# Patient Record
Sex: Male | Born: 1975 | Hispanic: No | Marital: Single
Health system: Southern US, Community
[De-identification: ages and names within clinical notes are randomized; demographics above are authoritative.]

---

## 2013-03-15 ENCOUNTER — Emergency Department (HOSPITAL_COMMUNITY): Payer: No Typology Code available for payment source

## 2013-03-15 ENCOUNTER — Emergency Department (HOSPITAL_COMMUNITY)
Admission: EM | Admit: 2013-03-15 | Discharge: 2013-03-15 | Disposition: A | Discharge status: Home / Self Care | Payer: No Typology Code available for payment source | Attending: Emergency Medicine | Admitting: Emergency Medicine

## 2013-03-15 ENCOUNTER — Encounter (HOSPITAL_COMMUNITY): Payer: Self-pay | Admitting: Radiology

## 2013-03-15 DIAGNOSIS — S0280XA Fracture of other specified skull and facial bones, unspecified side, initial encounter for closed fracture: Secondary | ICD-10-CM | POA: Insufficient documentation

## 2013-03-15 DIAGNOSIS — S59919A Unspecified injury of unspecified forearm, initial encounter: Secondary | ICD-10-CM

## 2013-03-15 DIAGNOSIS — S59909A Unspecified injury of unspecified elbow, initial encounter: Secondary | ICD-10-CM | POA: Insufficient documentation

## 2013-03-15 DIAGNOSIS — S0181XA Laceration without foreign body of other part of head, initial encounter: Secondary | ICD-10-CM

## 2013-03-15 DIAGNOSIS — S6990XA Unspecified injury of unspecified wrist, hand and finger(s), initial encounter: Secondary | ICD-10-CM | POA: Insufficient documentation

## 2013-03-15 DIAGNOSIS — S40019A Contusion of unspecified shoulder, initial encounter: Secondary | ICD-10-CM | POA: Insufficient documentation

## 2013-03-15 DIAGNOSIS — S161XXA Strain of muscle, fascia and tendon at neck level, initial encounter: Secondary | ICD-10-CM

## 2013-03-15 DIAGNOSIS — S0292XA Unspecified fracture of facial bones, initial encounter for closed fracture: Secondary | ICD-10-CM

## 2013-03-15 DIAGNOSIS — Z23 Encounter for immunization: Secondary | ICD-10-CM | POA: Insufficient documentation

## 2013-03-15 DIAGNOSIS — Z880 Allergy status to penicillin: Secondary | ICD-10-CM | POA: Insufficient documentation

## 2013-03-15 DIAGNOSIS — Y9389 Activity, other specified: Secondary | ICD-10-CM | POA: Insufficient documentation

## 2013-03-15 DIAGNOSIS — S40021A Contusion of right upper arm, initial encounter: Secondary | ICD-10-CM

## 2013-03-15 DIAGNOSIS — S0180XA Unspecified open wound of other part of head, initial encounter: Secondary | ICD-10-CM | POA: Insufficient documentation

## 2013-03-15 DIAGNOSIS — S139XXA Sprain of joints and ligaments of unspecified parts of neck, initial encounter: Secondary | ICD-10-CM | POA: Insufficient documentation

## 2013-03-15 DIAGNOSIS — Y92410 Unspecified street and highway as the place of occurrence of the external cause: Secondary | ICD-10-CM | POA: Insufficient documentation

## 2013-03-15 LAB — URINALYSIS, ROUTINE W REFLEX MICROSCOPIC
Bilirubin Urine: NEGATIVE
Glucose, UA: NEGATIVE mg/dL
KETONES UR: NEGATIVE mg/dL
LEUKOCYTES UA: NEGATIVE
Nitrite: NEGATIVE
PROTEIN: NEGATIVE mg/dL
Specific Gravity, Urine: 1.03 (ref 1.005–1.030)
UROBILINOGEN UA: 0.2 mg/dL (ref 0.0–1.0)
pH: 6.5 (ref 5.0–8.0)

## 2013-03-15 LAB — PROTIME-INR
INR: 0.91 (ref 0.00–1.49)
Prothrombin Time: 12.1 seconds (ref 11.6–15.2)

## 2013-03-15 LAB — COMPREHENSIVE METABOLIC PANEL
ALT: 50 U/L (ref 0–53)
AST: 42 U/L — ABNORMAL HIGH (ref 0–37)
Albumin: 3.6 g/dL (ref 3.5–5.2)
Alkaline Phosphatase: 91 U/L (ref 39–117)
BILIRUBIN TOTAL: 0.3 mg/dL (ref 0.3–1.2)
BUN: 16 mg/dL (ref 6–23)
CHLORIDE: 103 meq/L (ref 96–112)
CO2: 24 meq/L (ref 19–32)
Calcium: 9.1 mg/dL (ref 8.4–10.5)
Creatinine, Ser: 1.16 mg/dL (ref 0.50–1.35)
GFR calc Af Amer: >90 mL/min (ref >90)
GFR, EST NON AFRICAN AMERICAN: 80 mL/min — AB (ref >90)
Glucose, Bld: 107 mg/dL — ABNORMAL HIGH (ref 70–99)
Potassium: 3.6 mEq/L — ABNORMAL LOW (ref 3.7–5.3)
Sodium: 138 mEq/L (ref 137–147)
Total Protein: 6.9 g/dL (ref 6.0–8.3)

## 2013-03-15 LAB — CBC
HCT: 45 % (ref 39.0–52.0)
Hemoglobin: 15.4 g/dL (ref 13.0–17.0)
MCH: 26.6 pg (ref 26.0–34.0)
MCHC: 34.2 g/dL (ref 30.0–36.0)
MCV: 77.9 fL — ABNORMAL LOW (ref 78.0–100.0)
PLATELETS: 220 10*3/uL (ref 150–400)
RBC: 5.78 MIL/uL (ref 4.22–5.81)
RDW: 13.9 % (ref 11.5–15.5)
WBC: 12.4 10*3/uL — AB (ref 4.0–10.5)

## 2013-03-15 LAB — URINE MICROSCOPIC-ADD ON

## 2013-03-15 MED ORDER — CLINDAMYCIN HCL 300 MG PO CAPS
300.0000 mg | ORAL_CAPSULE | Freq: Once | ORAL | Status: AC
  Administered 2013-03-15: 300 mg via ORAL
  Filled 2013-03-15: qty 1

## 2013-03-15 MED ORDER — FENTANYL CITRATE 0.05 MG/ML IJ SOLN
100.0000 ug | Freq: Once | INTRAMUSCULAR | Status: AC
  Administered 2013-03-15: 100 ug via INTRAVENOUS
  Filled 2013-03-15: qty 2

## 2013-03-15 MED ORDER — OXYCODONE-ACETAMINOPHEN 5-325 MG PO TABS: 1.0000 | ORAL_TABLET | ORAL | Status: AC | PRN

## 2013-03-15 MED ORDER — ONDANSETRON HCL 4 MG/2ML IJ SOLN
4.0000 mg | Freq: Once | INTRAMUSCULAR | Status: AC
Start: 2013-03-15 — End: 2013-03-15
  Administered 2013-03-15: 4 mg via INTRAVENOUS
  Filled 2013-03-15: qty 2

## 2013-03-15 MED ORDER — CLINDAMYCIN HCL 300 MG PO CAPS: 300.0000 mg | ORAL_CAPSULE | Freq: Four times a day (QID) | ORAL | Status: AC

## 2013-03-15 MED ORDER — ONDANSETRON 4 MG PO TBDP
4.0000 mg | ORAL_TABLET | Freq: Once | ORAL | Status: AC
  Administered 2013-03-15: 4 mg via ORAL
  Filled 2013-03-15: qty 1

## 2013-03-15 MED ORDER — METHOCARBAMOL 500 MG PO TABS
500.0000 mg | ORAL_TABLET | Freq: Once | ORAL | Status: AC
  Administered 2013-03-15: 500 mg via ORAL
  Filled 2013-03-15: qty 1

## 2013-03-15 MED ORDER — OXYCODONE-ACETAMINOPHEN 5-325 MG PO TABS
2.0000 | ORAL_TABLET | Freq: Once | ORAL | Status: AC
  Administered 2013-03-15: 2 via ORAL
  Filled 2013-03-15: qty 2

## 2013-03-15 MED ORDER — IOHEXOL 300 MG/ML  SOLN
100.0000 mL | Freq: Once | INTRAMUSCULAR | Status: AC | PRN
  Administered 2013-03-15: 100 mL via INTRAVENOUS

## 2013-03-15 MED ORDER — TETANUS-DIPHTH-ACELL PERTUSSIS 5-2.5-18.5 LF-MCG/0.5 IM SUSP
0.5000 mL | Freq: Once | INTRAMUSCULAR | Status: AC
  Administered 2013-03-15: 0.5 mL via INTRAMUSCULAR
  Filled 2013-03-15: qty 0.5

## 2013-03-15 MED ORDER — SODIUM CHLORIDE 0.9 % IV BOLUS (SEPSIS)
1000.0000 mL | Freq: Once | INTRAVENOUS | Status: AC
  Administered 2013-03-15: 1000 mL via INTRAVENOUS

## 2013-03-15 MED ORDER — METHOCARBAMOL 500 MG PO TABS: 500.0000 mg | ORAL_TABLET | Freq: Four times a day (QID) | ORAL | Status: AC | PRN

## 2013-03-15 NOTE — Progress Notes (Signed)
Orthopedic Tech Progress Note Patient Details:  Jonathan Nichols [birth year] [id number]  Ortho Devices Type of Ortho Device: Arm sling   Haskell Flirtewsome, Ondine Gemme M 03/15/2013, 6:47 AM
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
## 2013-03-15 NOTE — ED Notes (Signed)
Pt c/o nausea while ambulating, pt placed in sitting position in stretcher & given ice chips to see how well it is tolerated.
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
## 2013-03-15 NOTE — ED Notes (Signed)
Ortho paged. 

## 2013-03-15 NOTE — ED Notes (Signed)
Pt received x 3 lunch bags for self & family, pt received dosages of medications prior to discharge as requested for medications that he received a prescription for with discharge instructions, pt verbalizes understanding of follow up care & medication regimen, pt given ice pack to take home, pt verbalizes understanding of when to return to ED

## 2013-03-15 NOTE — ED Notes (Signed)
Pt removed his cervical collar. Pt given a urinal, and sat up in bed, with permission from [place], [place]

## 2013-03-15 NOTE — Discharge Instructions (Signed)
Call and make appointment to followup with the ophthalmologist. He also need to call and set appointment to see be seen by the ENT surgeon on Monday, 03/21/13. You should have your sutures removed at that time too. Take pain medications as prescribed. Recurrent return immediately for worsening pain, focal weakness, numbness, persistent vomiting, vision changes, fever or for any concerns. He should keep your head elevated and refrain from blowing her nose for the next 2 weeks. Take antibiotics as prescribed.   Cervical Sprain A cervical sprain is an injury in the neck in which the strong, fibrous tissues (ligaments) that connect your neck bones stretch or tear. Cervical sprains can range from mild to severe. Severe cervical sprains can cause the neck vertebrae to be unstable. This can lead to damage of the spinal cord and can result in serious nervous system problems. The amount of time it takes for a cervical sprain to get better depends on the cause and extent of the injury. Most cervical sprains heal in 1 to 3 weeks. CAUSES  Severe cervical sprains may be caused by:   Contact sport injuries (such as from football, rugby, wrestling, hockey, auto racing, gymnastics, diving, martial arts, or boxing).   Motor vehicle collisions.   Whiplash injuries. This is an injury from a sudden forward-and backward whipping movement of the head and neck.  Falls.  Mild cervical sprains may be caused by:   Being in an awkward position, such as while cradling a telephone between your ear and shoulder.   Sitting in a chair that does not offer proper support.   Working at a poorly Marketing executive station.   Looking up or down for long periods of time.  SYMPTOMS   Pain, soreness, stiffness, or a burning sensation in the front, back, or sides of the neck. This discomfort may develop immediately after the injury or slowly, 24 hours or more after the injury.   Pain or tenderness directly in the middle  of the back of the neck.   Shoulder or upper back pain.   Limited ability to move the neck.   Headache.   Dizziness.   Weakness, numbness, or tingling in the hands or arms.   Muscle spasms.   Difficulty swallowing or chewing.   Tenderness and swelling of the neck.  DIAGNOSIS  Most of the time your health care provider can diagnose a cervical sprain by taking your history and doing a physical exam. Your health care provider will ask about previous neck injuries and any known neck problems, such as arthritis in the neck. X-rays may be taken to find out if there are any other problems, such as with the bones of the neck. Other tests, such as a CT scan or MRI, may also be needed.  TREATMENT  Treatment depends on the severity of the cervical sprain. Mild sprains can be treated with rest, keeping the neck in place (immobilization), and pain medicines. Severe cervical sprains are immediately immobilized. Further treatment is done to help with pain, muscle spasms, and other symptoms and may include:  Medicines, such as pain relievers, numbing medicines, or muscle relaxants.   Physical therapy. This may involve stretching exercises, strengthening exercises, and posture training. Exercises and improved posture can help stabilize the neck, strengthen muscles, and help stop symptoms from returning.  HOME CARE INSTRUCTIONS   Put ice on the injured area.   Put ice in a plastic bag.   Place a towel between your skin and the bag.   Leave  the ice on for 15 20 minutes, 3 4 times a day.   If your injury was severe, you may have been given a cervical collar to wear. A cervical collar is a two-piece collar designed to keep your neck from moving while it heals.  Do not remove the collar unless instructed by your health care provider.  If you have long hair, keep it outside of the collar.  Ask your health care provider before making any adjustments to your collar. Minor adjustments  may be required over time to improve comfort and reduce pressure on your chin or on the back of your head.  Ifyou are allowed to remove the collar for cleaning or bathing, follow your health care provider's instructions on how to do so safely.  Keep your collar clean by wiping it with mild soap and water and drying it completely. If the collar you have been given includes removable pads, remove them every 1 2 days and hand wash them with soap and water. Allow them to air dry. They should be completely dry before you wear them in the collar.  If you are allowed to remove the collar for cleaning and bathing, wash and dry the skin of your neck. Check your skin for irritation or sores. If you see any, tell your health care provider.  Do not drive while wearing the collar.   Only take over-the-counter or prescription medicines for pain, discomfort, or fever as directed by your health care provider.   Keep all follow-up appointments as directed by your health care provider.   Keep all physical therapy appointments as directed by your health care provider.   Make any needed adjustments to your workstation to promote good posture.   Avoid positions and activities that make your symptoms worse.   Warm up and stretch before being active to help prevent problems.  SEEK MEDICAL CARE IF:   Your pain is not controlled with medicine.   You are unable to decrease your pain medicine over time as planned.   Your activity level is not improving as expected.  SEEK IMMEDIATE MEDICAL CARE IF:   You develop any bleeding.  You develop stomach upset.  You have signs of an allergic reaction to your medicine.   Your symptoms get worse.   You develop new, unexplained symptoms.   You have numbness, tingling, weakness, or paralysis in any part of your body.  MAKE SURE YOU:   Understand these instructions.  Will watch your condition.  Will get help right away if you are not doing well  or get worse. Document Released: 10/20/2006 Document Revised: 10/13/2012 Document Reviewed: 06/30/2012 [facility] Patient Information 2014 [place], [place].  Facial Laceration  A facial laceration is a cut on the face. These injuries can be painful and cause bleeding. Lacerations usually heal quickly, but they need special care to reduce scarring. DIAGNOSIS  Your health care provider will take a medical history, ask for details about how the injury occurred, and examine the wound to determine how deep the cut is. TREATMENT  Some facial lacerations may not require closure. Others may not be able to be closed because of an increased risk of infection. The risk of infection and the chance for successful closure will depend on various factors, including the amount of time since the injury occurred. The wound may be cleaned to help prevent infection. If closure is appropriate, pain medicines may be given if needed. Your health care provider will use stitches (sutures), wound glue (adhesive),  or skin adhesive strips to repair the laceration. These tools bring the skin edges together to allow for faster healing and a better cosmetic outcome. If needed, you may also be given a tetanus shot. HOME CARE INSTRUCTIONS  Only take over-the-counter or prescription medicines as directed by your health care provider.  Follow your health care provider's instructions for wound care. These instructions will vary depending on the technique used for closing the wound. For Sutures:  Keep the wound clean and dry.   If you were given a bandage (dressing), you should change it at least once a day. Also change the dressing if it becomes wet or dirty, or as directed by your health care provider.   Wash the wound with soap and water 2 times a day. Rinse the wound off with water to remove all soap. Pat the wound dry with a clean towel.   After cleaning, apply a thin layer of the antibiotic ointment recommended by your  health care provider. This will help prevent infection and keep the dressing from sticking.   You may shower as usual after the first 24 hours. Do not soak the wound in water until the sutures are removed.   Get your sutures removed as directed by your health care provider. With facial lacerations, sutures should usually be taken out after 4 5 days to avoid stitch marks.   Wait a few days after your sutures are removed before applying any makeup. For Skin Adhesive Strips:  Keep the wound clean and dry.   Do not get the skin adhesive strips wet. You may bathe carefully, using caution to keep the wound dry.   If the wound gets wet, pat it dry with a clean towel.   Skin adhesive strips will fall off on their own. You may trim the strips as the wound heals. Do not remove skin adhesive strips that are still stuck to the wound. They will fall off in time.  For Wound Adhesive:  You may briefly wet your wound in the shower or bath. Do not soak or scrub the wound. Do not swim. Avoid periods of heavy sweating until the skin adhesive has fallen off on its own. After showering or bathing, gently pat the wound dry with a clean towel.   Do not apply liquid medicine, cream medicine, ointment medicine, or makeup to your wound while the skin adhesive is in place. This may loosen the film before your wound is healed.   If a dressing is placed over the wound, be careful not to apply tape directly over the skin adhesive. This may cause the adhesive to be pulled off before the wound is healed.   Avoid prolonged exposure to sunlight or tanning lamps while the skin adhesive is in place.  The skin adhesive will usually remain in place for 5 10 days, then naturally fall off the skin. Do not pick at the adhesive film.  After Healing: Once the wound has healed, cover the wound with sunscreen during the day for 1 full year. This can help minimize scarring. Exposure to ultraviolet light in the first year  will darken the scar. It can take 1 2 years for the scar to lose its redness and to heal completely.  SEEK IMMEDIATE MEDICAL CARE IF:  You have redness, pain, or swelling around the wound.   You see ayellowish-white fluid (pus) coming from the wound.   You have chills or a fever.  MAKE SURE YOU:  Understand these instructions.  Will  watch your condition.  Will get help right away if you are not doing well or get worse. Document Released: 01/31/2004 Document Revised: 10/13/2012 Document Reviewed: 08/05/2012 [facility] Patient Information 2014 [place], [place].  Facial Fracture A facial fracture is a break in one of the bones of your face. HOME CARE INSTRUCTIONS   Protect the injured part of your face until it is healed.  Do not participate in activities which give chance for re-injury until your doctor approves.  Gently wash and dry your face.  Wear head and facial protection while riding a bicycle, motorcycle, or snowmobile. SEEK MEDICAL CARE IF:   An oral temperature above 102 F (38.9 C) develops.  You have severe headaches or notice changes in your vision.  You have new numbness or tingling in your face.  You develop nausea (feeling sick to your stomach), vomiting or a stiff neck. SEEK IMMEDIATE MEDICAL CARE IF:   You develop difficulty seeing or experience double vision.  You become dizzy, lightheaded, or faint.  You develop trouble speaking, breathing, or swallowing.  You have a watery discharge from your nose or ear. MAKE SURE YOU:   Understand these instructions.  Will watch your condition.  Will get help right away if you are not doing well or get worse. Document Released: 12/23/2004 Document Revised: 03/17/2011 Document Reviewed: 08/12/2007 [facility] Patient Information 2014 [place], [place].  Head Injury, Adult You have received a head injury. It does not appear serious at this time. Headaches and vomiting are common following head injury. It should  be easy to awaken from sleeping. Sometimes it is necessary for you to stay in the emergency department for a while for observation. Sometimes admission to the hospital may be needed. After injuries such as yours, most problems occur within the first 24 hours, but side effects may occur up to 7 10 days after the injury. It is important for you to carefully monitor your condition and contact your health care provider or seek immediate medical care if there is a change in your condition. WHAT ARE THE TYPES OF HEAD INJURIES? Head injuries can be as minor as a bump. Some head injuries can be more severe. More severe head injuries include:  A jarring injury to the brain (concussion).  A bruise of the brain (contusion). This mean there is bleeding in the brain that can cause swelling.  A cracked skull (skull fracture).  Bleeding in the brain that collects, clots, and forms a bump (hematoma). WHAT CAUSES A HEAD INJURY? A serious head injury is most likely to happen to someone who is in a car wreck and is not wearing a seat belt. Other causes of major head injuries include bicycle or motorcycle accidents, sports injuries, and falls. HOW ARE HEAD INJURIES DIAGNOSED? A complete history of the event leading to the injury and your current symptoms will be helpful in diagnosing head injuries. Many times, pictures of the brain, such as CT or MRI are needed to see the extent of the injury. Often, an overnight hospital stay is necessary for observation.  WHEN SHOULD I SEEK IMMEDIATE MEDICAL CARE?  You should get help right away if:  You have confusion or drowsiness.  You feel sick to your stomach (nauseous) or have continued, forceful vomiting.  You have dizziness or unsteadiness that is getting worse.  You have severe, continued headaches not relieved by medicine. Only take over-the-counter or prescription medicines for pain, fever, or discomfort as directed by your health care provider.  You do not  have  normal function of the arms or legs or are unable to walk.  You notice changes in the black spots in the center of the colored part of your eye (pupil).  You have a clear or bloody fluid coming from your nose or ears.  You have a loss of vision. During the next 24 hours after the injury, you must stay with someone who can watch you for the warning signs. This person should contact local emergency services (911 in the [place] if you have seizures, you become unconscious, or you are unable to wake up. HOW CAN I PREVENT A HEAD INJURY IN THE FUTURE? The most important factor for preventing major head injuries is avoiding motor vehicle accidents. To minimize the potential for damage to your head, it is crucial to wear seat belts while riding in motor vehicles. Wearing helmets while bike riding and playing collision sports (like football) is also helpful. Also, avoiding dangerous activities around the house will further help reduce your risk of head injury.  WHEN CAN I RETURN TO NORMAL ACTIVITIES AND ATHLETICS? You should be reevaluated by your health care provider before returning to these activities. If you have any of the following symptoms, you should not return to activities or contact sports until 1 week after the symptoms have stopped:  Persistent headache.  Dizziness or vertigo.  Poor attention and concentration.  Confusion.  Memory problems.  Nausea or vomiting.  Fatigue or tire easily.  Irritability.  Intolerant of bright lights or loud noises.  Anxiety or depression.  Disturbed sleep. MAKE SURE YOU:   Understand these instructions.  Will watch your condition.  Will get help right away if you are not doing well or get worse. Document Released: 12/23/2004 Document Revised: 10/13/2012 Document Reviewed: 08/30/2012 [facility] Patient Information 2014 [place], [place].  Multiple Traumas Bruises and sore muscles are common after injuries. These usually feel worse for the first  24 hours. You may have more stiffness and soreness over the next several hours to several days. It may be worse when you wake up the first morning following your injury. You will improve with each passing day. The amount of improvement often depends on the amount of damage done. HOME CARE INSTRUCTIONS   Put ice on the injured area.  Put ice in a plastic bag.  Place a towel between your skin and the bag.  Leave the ice on for 15-20 minutes, 03-04 times a day.  Drink more fluids than normal. Do not drink alcohol.  Take a hot or warm shower or bath once or twice a day. This will increase blood flow to sore muscles.  Return to activity as tolerated. Lifting may aggravate neck or back pain.  Only take over-the-counter or prescription medicines for pain, discomfort, or fever as directed by your caregiver. Do not use aspirin. This may increase bruising because aspirin decreases the ability of blood to form clots that stop bleeding. SEEK IMMEDIATE MEDICAL CARE IF:  You have numbness, tingling, weakness, or problems with the use of your arms or legs.  You have severe headaches not relieved with medicines.  You have changes in bowel or bladder control.  You develop increasing pain in any areas of the body.  You have shortness of breath, dizziness, or fainting.  You have a fever.  You feel sick to your stomach (nauseous), throw up (vomit), or sweat.  You have increasing belly pain.  You have blood in your urine, stool, or vomit.  You have pain in  either shoulder or in an area where a shoulder strap would be.  You feel lightheaded or faint.  You feel you may be getting worse rather than better.  Your symptoms are worsening. MAKE SURE YOU:   Understand these instructions.  Will watch your condition.  Will get help right away if you are not doing well or get worse. Document Released: 01/12/2007 Document Revised: 03/17/2011 Document Reviewed: 06/12/2008 [facility] Patient  Information 2014 [place], [place].

## 2013-03-15 NOTE — ED Notes (Signed)
Pt states that he does not want anything else for pain.  

## 2013-03-15 NOTE — ED Notes (Signed)
Effie ShyWentz, MD at bedside, [facility], RN at bedside to ambulate pt & to give lunch bag & PO fluids

## 2013-03-15 NOTE — ED Notes (Signed)
Patient transported to CT 
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
## 2013-03-17 ENCOUNTER — Emergency Department (HOSPITAL_COMMUNITY)
Admission: EM | Admit: 2013-03-17 | Discharge: 2013-03-17 | Disposition: A | Discharge status: Home / Self Care | Payer: No Typology Code available for payment source | Attending: Emergency Medicine | Admitting: Emergency Medicine

## 2013-03-17 ENCOUNTER — Emergency Department (HOSPITAL_COMMUNITY): Payer: No Typology Code available for payment source

## 2013-03-17 DIAGNOSIS — H538 Other visual disturbances: Secondary | ICD-10-CM | POA: Insufficient documentation

## 2013-03-17 DIAGNOSIS — M549 Dorsalgia, unspecified: Secondary | ICD-10-CM | POA: Insufficient documentation

## 2013-03-17 DIAGNOSIS — Z792 Long term (current) use of antibiotics: Secondary | ICD-10-CM | POA: Insufficient documentation

## 2013-03-17 DIAGNOSIS — Z79899 Other long term (current) drug therapy: Secondary | ICD-10-CM | POA: Insufficient documentation

## 2013-03-17 DIAGNOSIS — M25539 Pain in unspecified wrist: Secondary | ICD-10-CM | POA: Insufficient documentation

## 2013-03-17 DIAGNOSIS — G8911 Acute pain due to trauma: Secondary | ICD-10-CM | POA: Insufficient documentation

## 2013-03-17 DIAGNOSIS — F0781 Postconcussional syndrome: Secondary | ICD-10-CM

## 2013-03-17 DIAGNOSIS — Z880 Allergy status to penicillin: Secondary | ICD-10-CM | POA: Insufficient documentation

## 2013-03-17 LAB — CBC WITH DIFFERENTIAL/PLATELET
BASOS ABS: 0.1 10*3/uL (ref 0.0–0.1)
Basophils Relative: 1 % (ref 0–1)
EOS ABS: 0.3 10*3/uL (ref 0.0–0.7)
EOS PCT: 3 % (ref 0–5)
HEMATOCRIT: 45.3 % (ref 39.0–52.0)
Hemoglobin: 15.4 g/dL (ref 13.0–17.0)
Lymphocytes Relative: 11 % — ABNORMAL LOW (ref 12–46)
Lymphs Abs: 1.2 10*3/uL (ref 0.7–4.0)
MCH: 26.5 pg (ref 26.0–34.0)
MCHC: 34 g/dL (ref 30.0–36.0)
MCV: 77.8 fL — AB (ref 78.0–100.0)
Monocytes Absolute: 0.9 10*3/uL (ref 0.1–1.0)
Monocytes Relative: 8 % (ref 3–12)
NEUTROS ABS: 8.8 10*3/uL — AB (ref 1.7–7.7)
Neutrophils Relative %: 78 % — ABNORMAL HIGH (ref 43–77)
Platelets: 210 10*3/uL (ref 150–400)
RBC: 5.82 MIL/uL — ABNORMAL HIGH (ref 4.22–5.81)
RDW: 13.5 % (ref 11.5–15.5)
WBC: 11.3 10*3/uL — ABNORMAL HIGH (ref 4.0–10.5)

## 2013-03-17 LAB — COMPREHENSIVE METABOLIC PANEL
ALBUMIN: 3.7 g/dL (ref 3.5–5.2)
ALT: 30 U/L (ref 0–53)
AST: 28 U/L (ref 0–37)
Alkaline Phosphatase: 93 U/L (ref 39–117)
BUN: 15 mg/dL (ref 6–23)
CALCIUM: 9.2 mg/dL (ref 8.4–10.5)
CO2: 24 mEq/L (ref 19–32)
CREATININE: 1.14 mg/dL (ref 0.50–1.35)
Chloride: 99 mEq/L (ref 96–112)
GFR calc Af Amer: >90 mL/min (ref >90)
GFR calc non Af Amer: 82 mL/min — ABNORMAL LOW (ref >90)
Glucose, Bld: 94 mg/dL (ref 70–99)
Potassium: 4 mEq/L (ref 3.7–5.3)
Sodium: 138 mEq/L (ref 137–147)
TOTAL PROTEIN: 7.2 g/dL (ref 6.0–8.3)
Total Bilirubin: 1.2 mg/dL (ref 0.3–1.2)

## 2013-03-17 MED ORDER — ONDANSETRON HCL 4 MG/2ML IJ SOLN
4.0000 mg | Freq: Once | INTRAMUSCULAR | Status: AC
  Administered 2013-03-17: 4 mg via INTRAVENOUS
  Filled 2013-03-17: qty 2

## 2013-03-17 MED ORDER — SODIUM CHLORIDE 0.9 % IV SOLN
1000.0000 mL | Freq: Once | INTRAVENOUS | Status: AC
  Administered 2013-03-17: 1000 mL via INTRAVENOUS

## 2013-03-17 MED ORDER — MORPHINE SULFATE 4 MG/ML IJ SOLN
4.0000 mg | Freq: Once | INTRAMUSCULAR | Status: DC
  Filled 2013-03-17: qty 1

## 2013-03-17 MED ORDER — MORPHINE SULFATE 4 MG/ML IJ SOLN
4.0000 mg | Freq: Once | INTRAMUSCULAR | Status: AC
  Administered 2013-03-17: 4 mg via INTRAVENOUS
  Filled 2013-03-17: qty 1

## 2013-03-17 MED ORDER — ONDANSETRON HCL 4 MG PO TABS: 4.0000 mg | ORAL_TABLET | Freq: Three times a day (TID) | ORAL | Status: AC | PRN

## 2013-03-17 MED ORDER — ONDANSETRON HCL 4 MG PO TABS: 4.0000 mg | ORAL_TABLET | Freq: Three times a day (TID) | ORAL | Status: DC | PRN

## 2013-03-17 MED ORDER — HYDROCODONE-ACETAMINOPHEN 5-325 MG PO TABS: 1.0000 | ORAL_TABLET | Freq: Four times a day (QID) | ORAL | Status: AC | PRN
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
## 2013-03-17 NOTE — ED Notes (Signed)
Spoke with male family member re: need [facility] and assistance for pt to get his meds.  Pt/family member in ED Waiting Room. CSW left to get RNCM to help pt with meds and when CSW/RNCM returned to waiting area, pt was gone.  RNCM/CSW looked for pt outside the ED but he was not there.
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
## 2013-03-17 NOTE — ED Notes (Signed)
Pt & family made aware of plan of care & transportation plans

## 2013-03-17 NOTE — Discharge Instructions (Signed)

## 2013-03-17 NOTE — ED Notes (Signed)
Social Work currently working on Investment banker, corporateproviding transportation voucher for pt & family

## 2013-03-17 NOTE — Progress Notes (Signed)
Patient's sister was wandering ED looking for Brother who was in CT for scan.  I escorted her back to patients room and waited with her until patient returned.Assisted sister with gathering information regarding patient discharge and future care.  Dr. Elesa MassedWard and nurse Toniann FailWendy were very supportive and empathic listerners. Promoted information sharing between staff and family. Will follow as needed.  03/17/13 1500  Clinical Encounter Type  Visited With Patient;Family;Patient and family together;Health care provider  Visit Type Initial;Social support;ED  [facility]  Spiritual Encounters  Spiritual Needs Emotional  Stress Factors  Family Stress Factors Exhausted  Jonathan JarvisWatlington, Jonathan Nichols, Chaplain,pager [phone]
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
## 2014-11-11 IMAGING — CT CT HEAD W/O CM
2 series · 16 of 30 positions shown, 18 images · non-contrast
Comparison: CT HEAD W/O CM dated 03/15/2013

CLINICAL DATA: Headache

EXAM:
CT HEAD WITHOUT CONTRAST
TECHNIQUE: Contiguous axial images were obtained from the base of the skull
through the vertex without intravenous contrast.

[Series 2: head w/o · axial · non-contrast · 0.49mm/px · z∈[+110,+240]mm · 8 of 34 slices shown, 10 images]
[im 4/34  brain]
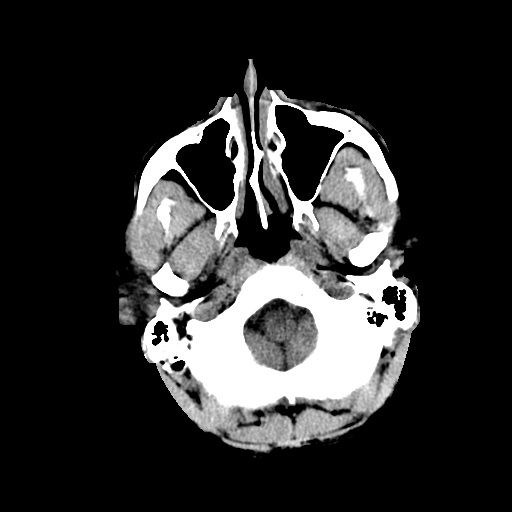
[im 4/34  bone]
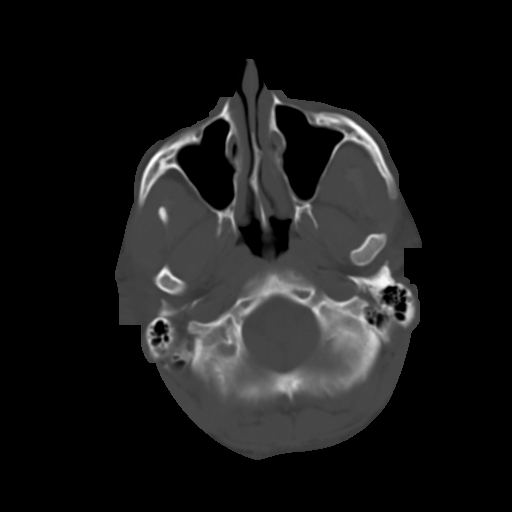
[im 8/34  brain]
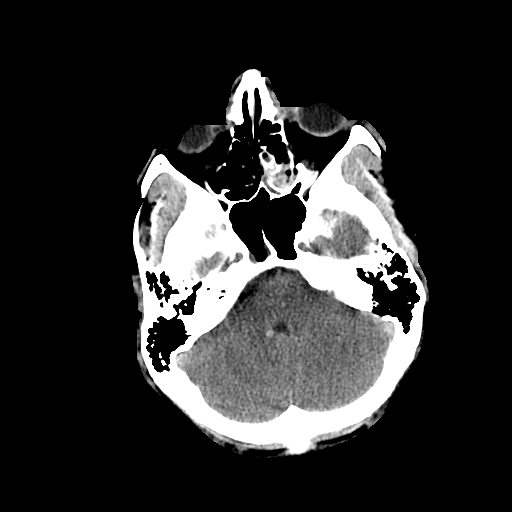
[im 12/34  brain]
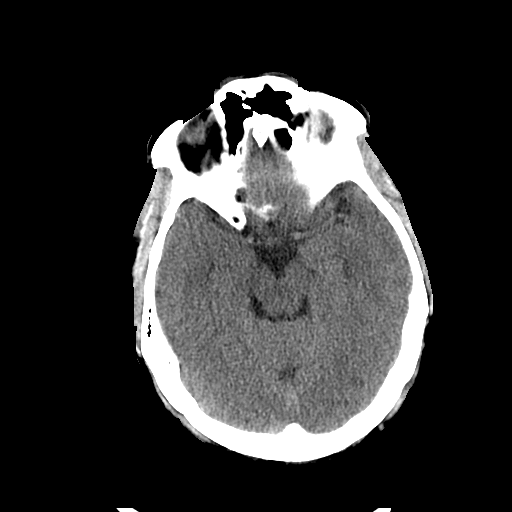
[im 15/34  brain]
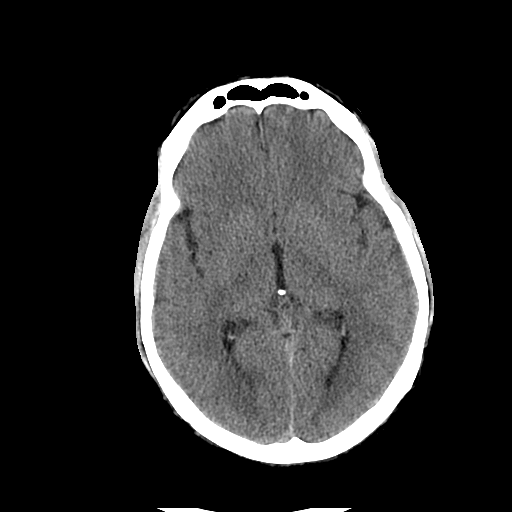
[im 19/34  brain]
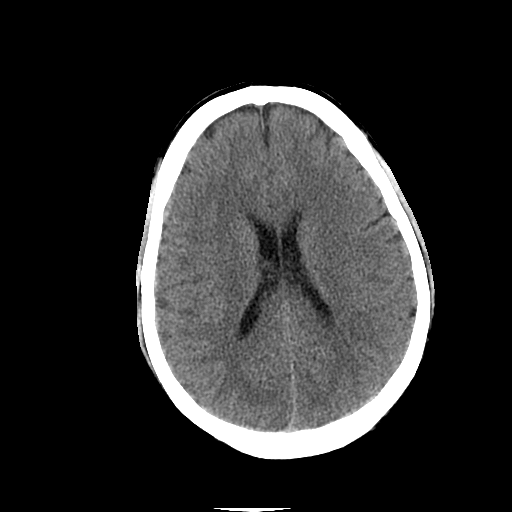
[im 19/34  bone]
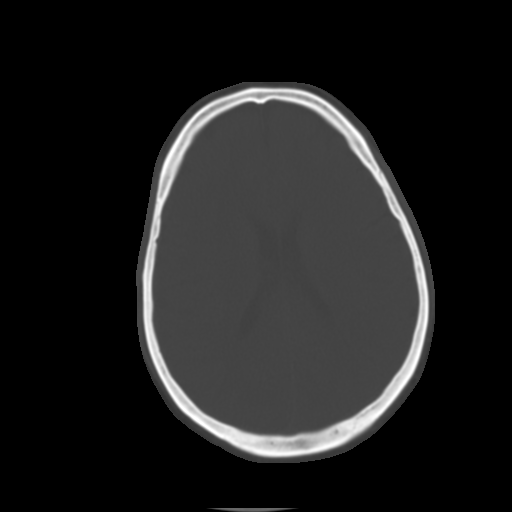
[im 23/34  brain]
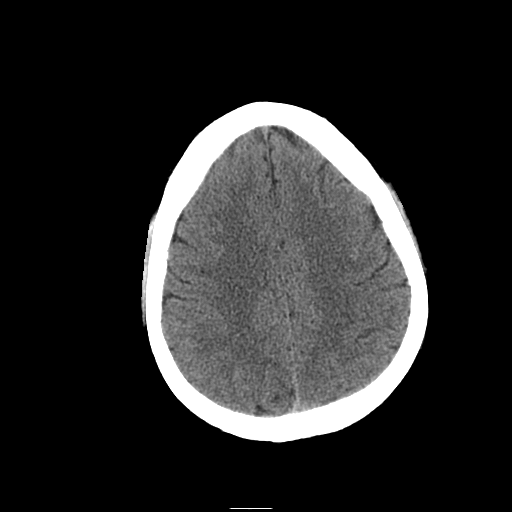
[im 26/34  brain]
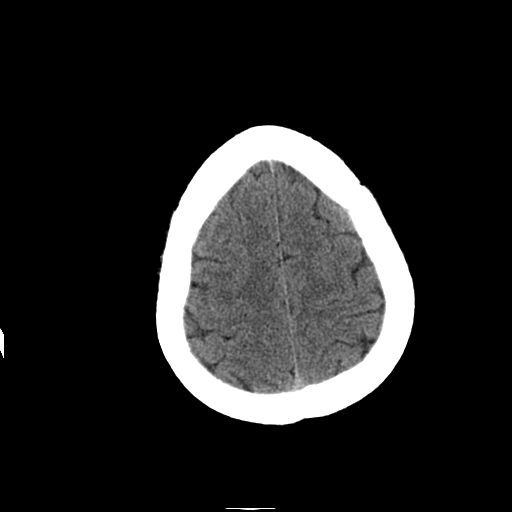
[im 30/34  brain]
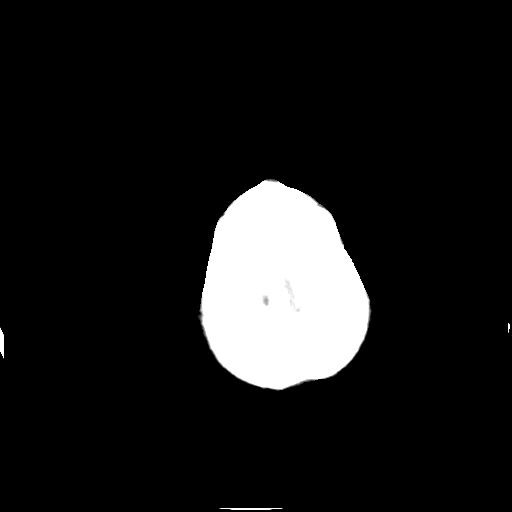

[Series 3: head w/o bone · axial · non-contrast · 0.49mm/px · z∈[+110,+243]mm · 8 of 67 slices shown]
[im 7/67  bone]
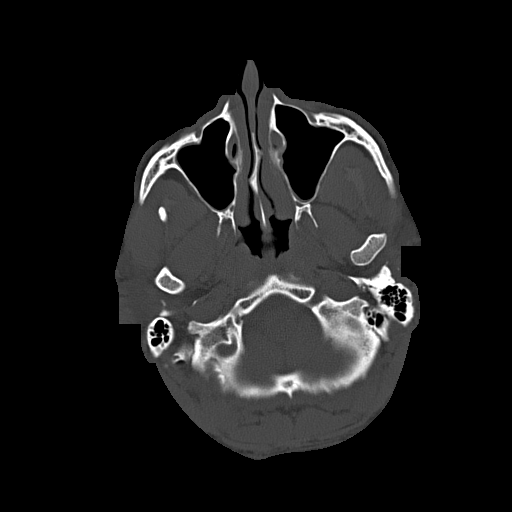
[im 14/67  bone]
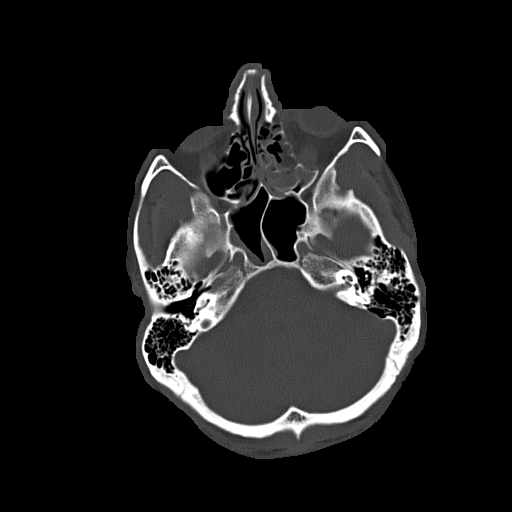
[im 21/67  bone]
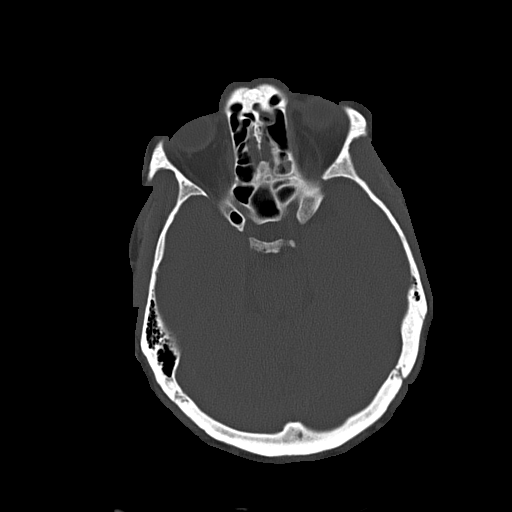
[im 28/67  bone]
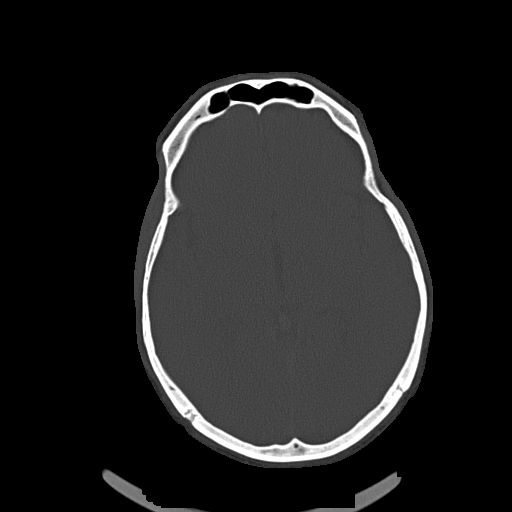
[im 39/67  bone]
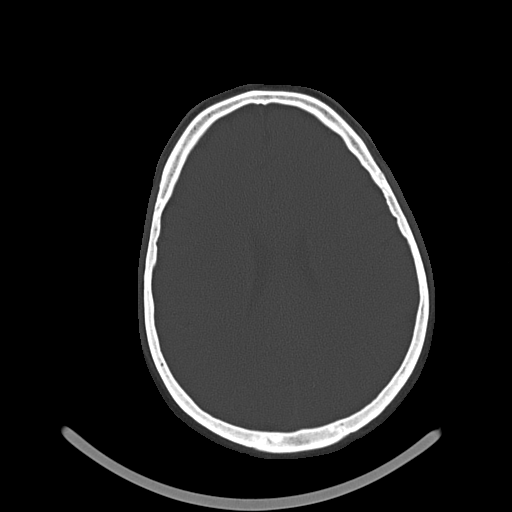
[im 46/67  bone]
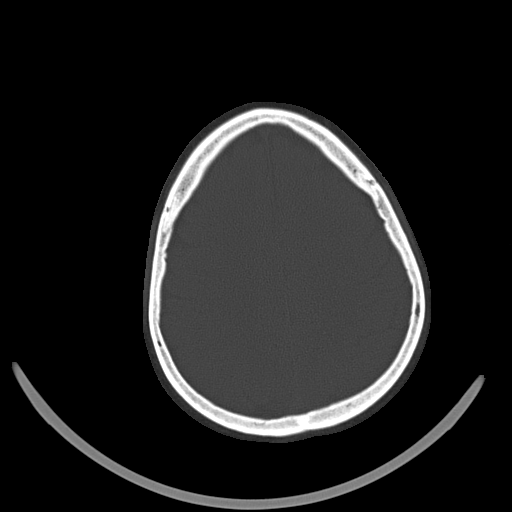
[im 53/67  bone]
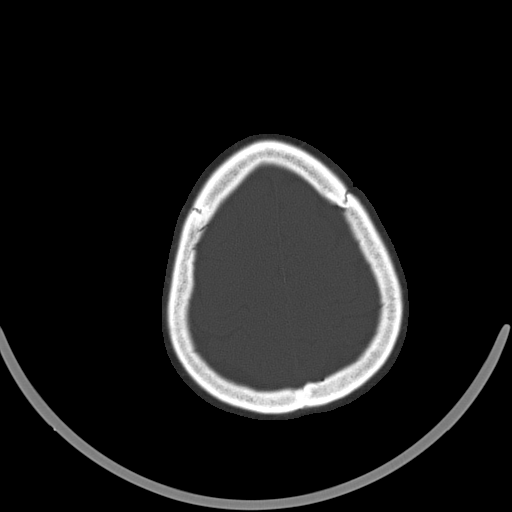
[im 60/67  bone]
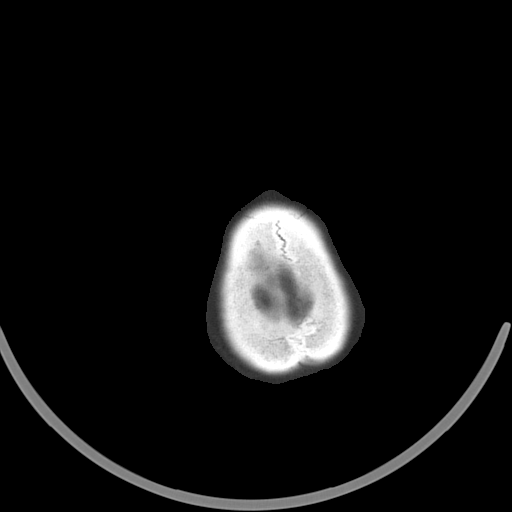

[16 of 30 positions shown; findings below may reference images not displayed]

FINDINGS: There is no evidence of mass effect, midline shift or extra-axial
fluid collections. There is no evidence of a space-occupying lesion
or intracranial hemorrhage. There is no evidence of a cortical-based
area of acute infarction.

The ventricles and sulci are appropriate for the patient's age. The
basal cisterns are patent.

Visualized portions of the orbits are unremarkable. There is left
ethmoid sinus mucosal thickening.

The osseous structures are unremarkable.
IMPRESSION: No acute intracranial pathology.

Left ethmoid sinus mucosal thickening.

## 2019-08-27 ENCOUNTER — Ambulatory Visit (HOSPITAL_COMMUNITY): Payer: Self-pay
